# Patient Record
Sex: Female | Born: 1990 | Race: Black or African American | Hispanic: No | Marital: Married | Smoking: Never smoker
Health system: Southern US, Community
[De-identification: ages and names within clinical notes are randomized; demographics above are authoritative.]

## PROBLEM LIST (undated history)

## (undated) DIAGNOSIS — E119 Type 2 diabetes mellitus without complications: Secondary | ICD-10-CM

## (undated) DIAGNOSIS — I10 Essential (primary) hypertension: Secondary | ICD-10-CM

---
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
## 2017-07-02 ENCOUNTER — Emergency Department (HOSPITAL_BASED_OUTPATIENT_CLINIC_OR_DEPARTMENT_OTHER)
Admission: EM | Admit: 2017-07-02 | Discharge: 2017-07-03 | Disposition: A | Discharge status: Home / Self Care | Payer: Self-pay | Attending: Emergency Medicine | Admitting: Emergency Medicine

## 2017-07-02 ENCOUNTER — Encounter (HOSPITAL_BASED_OUTPATIENT_CLINIC_OR_DEPARTMENT_OTHER): Payer: Self-pay | Admitting: Emergency Medicine

## 2017-07-02 ENCOUNTER — Other Ambulatory Visit: Payer: Self-pay

## 2017-07-02 ENCOUNTER — Emergency Department (HOSPITAL_BASED_OUTPATIENT_CLINIC_OR_DEPARTMENT_OTHER): Payer: Self-pay

## 2017-07-02 DIAGNOSIS — I10 Essential (primary) hypertension: Secondary | ICD-10-CM | POA: Insufficient documentation

## 2017-07-02 DIAGNOSIS — R509 Fever, unspecified: Secondary | ICD-10-CM | POA: Insufficient documentation

## 2017-07-02 DIAGNOSIS — R1011 Right upper quadrant pain: Secondary | ICD-10-CM | POA: Insufficient documentation

## 2017-07-02 DIAGNOSIS — R638 Other symptoms and signs concerning food and fluid intake: Secondary | ICD-10-CM | POA: Insufficient documentation

## 2017-07-02 DIAGNOSIS — R110 Nausea: Secondary | ICD-10-CM | POA: Insufficient documentation

## 2017-07-02 DIAGNOSIS — Z79899 Other long term (current) drug therapy: Secondary | ICD-10-CM | POA: Insufficient documentation

## 2017-07-02 DIAGNOSIS — E119 Type 2 diabetes mellitus without complications: Secondary | ICD-10-CM | POA: Insufficient documentation

## 2017-07-02 HISTORY — DX: Type 2 diabetes mellitus without complications: E11.9

## 2017-07-02 HISTORY — DX: Essential (primary) hypertension: I10

## 2017-07-02 LAB — URINALYSIS, ROUTINE W REFLEX MICROSCOPIC
BILIRUBIN URINE: NEGATIVE
Glucose, UA: NEGATIVE mg/dL
KETONES UR: 15 mg/dL — AB
NITRITE: NEGATIVE
PH: 5.5 (ref 5.0–8.0)
PROTEIN: NEGATIVE mg/dL
Specific Gravity, Urine: 1.01 (ref 1.005–1.030)

## 2017-07-02 LAB — URINALYSIS, MICROSCOPIC (REFLEX)

## 2017-07-02 LAB — COMPREHENSIVE METABOLIC PANEL
ALBUMIN: 3.8 g/dL (ref 3.5–5.0)
ALK PHOS: 66 U/L (ref 38–126)
ALT: 28 U/L (ref 14–54)
ANION GAP: 11 (ref 5–15)
AST: 29 U/L (ref 15–41)
BUN: 9 mg/dL (ref 6–20)
CALCIUM: 9.2 mg/dL (ref 8.9–10.3)
CO2: 22 mmol/L (ref 22–32)
Chloride: 102 mmol/L (ref 101–111)
Creatinine, Ser: 1.08 mg/dL — ABNORMAL HIGH (ref 0.44–1.00)
GFR calc non Af Amer: >60 mL/min (ref >60)
GLUCOSE: 155 mg/dL — AB (ref 65–99)
POTASSIUM: 3.9 mmol/L (ref 3.5–5.1)
SODIUM: 135 mmol/L (ref 135–145)
Total Bilirubin: 0.8 mg/dL (ref 0.3–1.2)
Total Protein: 8.2 g/dL — ABNORMAL HIGH (ref 6.5–8.1)

## 2017-07-02 LAB — CBC
HEMATOCRIT: 35.1 % — AB (ref 36.0–46.0)
HEMOGLOBIN: 11.6 g/dL — AB (ref 12.0–15.0)
MCH: 28.5 pg (ref 26.0–34.0)
MCHC: 33 g/dL (ref 30.0–36.0)
MCV: 86.2 fL (ref 78.0–100.0)
Platelets: 319 10*3/uL (ref 150–400)
RBC: 4.07 MIL/uL (ref 3.87–5.11)
RDW: 12.3 % (ref 11.5–15.5)
WBC: 11.6 10*3/uL — ABNORMAL HIGH (ref 4.0–10.5)

## 2017-07-02 LAB — I-STAT CG4 LACTIC ACID, ED: LACTIC ACID, VENOUS: 1.17 mmol/L (ref 0.5–1.9)

## 2017-07-02 LAB — PROTIME-INR
INR: 1.16
PROTHROMBIN TIME: 14.7 s (ref 11.4–15.2)

## 2017-07-02 LAB — PREGNANCY, URINE: PREG TEST UR: NEGATIVE

## 2017-07-02 MED ORDER — FENTANYL CITRATE (PF) 100 MCG/2ML IJ SOLN
100.0000 ug | Freq: Once | INTRAMUSCULAR | Status: AC
  Administered 2017-07-02: 100 ug via INTRAVENOUS
  Filled 2017-07-02: qty 2

## 2017-07-02 MED ORDER — SODIUM CHLORIDE 0.9 % IV BOLUS (SEPSIS): 1000.0000 mL | Freq: Once | INTRAVENOUS | Status: DC

## 2017-07-02 MED ORDER — PIPERACILLIN-TAZOBACTAM 3.375 G IVPB: 3.3750 g | Freq: Three times a day (TID) | INTRAVENOUS | Status: DC

## 2017-07-02 MED ORDER — ACETAMINOPHEN 325 MG PO TABS
650.0000 mg | ORAL_TABLET | Freq: Once | ORAL | Status: AC
  Administered 2017-07-02: 650 mg via ORAL
  Filled 2017-07-02: qty 2

## 2017-07-02 MED ORDER — SODIUM CHLORIDE 0.9 % IV BOLUS (SEPSIS): 500.0000 mL | Freq: Once | INTRAVENOUS | Status: DC

## 2017-07-02 MED ORDER — PIPERACILLIN-TAZOBACTAM 3.375 G IVPB 30 MIN
3.3750 g | Freq: Once | INTRAVENOUS | Status: AC
  Administered 2017-07-02: 3.375 g via INTRAVENOUS
  Filled 2017-07-02 (×2): qty 50

## 2017-07-02 MED ORDER — SODIUM CHLORIDE 0.9 % IV BOLUS (SEPSIS)
2000.0000 mL | Freq: Once | INTRAVENOUS | Status: AC
  Administered 2017-07-02: 2000 mL via INTRAVENOUS

## 2017-07-02 MED ORDER — METOCLOPRAMIDE HCL 5 MG/ML IJ SOLN
10.0000 mg | Freq: Once | INTRAMUSCULAR | Status: AC
  Administered 2017-07-02: 10 mg via INTRAVENOUS
  Filled 2017-07-02: qty 2

## 2017-07-02 MED ORDER — IOPAMIDOL (ISOVUE-300) INJECTION 61%
100.0000 mL | Freq: Once | INTRAVENOUS | Status: AC | PRN
  Administered 2017-07-02: 100 mL via INTRAVENOUS

## 2017-07-02 NOTE — ED Triage Notes (Signed)
Sent from [facility] with fever and R flank pain x 2 days.

## 2017-07-02 NOTE — Progress Notes (Signed)
Pharmacy Antibiotic Note  Lauren Joyce is a 27 y.o. female admitted on 07/02/2017 with concern for intra-abdominal infection.  Pharmacy has been consulted for Zosyn dosing.  Plan: 1. Start Zosyn 3.375g IV every 8 hours (infused over 4 hours) 2. Will continue to follow renal function, culture results, LOT, and antibiotic de-escalation plans   Height: 5\' 7"  (170.2 cm) Weight: 232 lb (105.2 kg) IBW/kg (Calculated) : 61.6  Temp (24hrs), Avg:102.6 F (39.2 C), Min:101.9 F (38.8 C), Max:103.2 F (39.6 C)  Recent Labs  Lab 07/02/17 2032 07/02/17 2048  WBC 11.6*  --   CREATININE 1.08*  --   LATICACIDVEN  --  1.17    Estimated Creatinine Clearance: 96.7 mL/min (A) (by C-G formula based on SCr of 1.08 mg/dL (H)).    No Known Allergies  Antimicrobials this admission: Zosyn 1/26 >>  Dose adjustments this admission: n/a  Microbiology results: 1/26 BCx >> 1/26 UCx >>  Thank you for allowing pharmacy to be a part of this patient's care.  Lauren Joyce, Lauren Joyce 07/02/2017 9:41 PM

## 2017-07-02 NOTE — ED Provider Notes (Signed)
[facility] EMERGENCY DEPARTMENT Provider Note   CSN: [id number] Arrival date & time: 07/02/17  1844     History   Chief Complaint Chief Complaint  Patient presents with  . Flank Pain  . Fever    HPI Lauren Joyce is a 27 y.o. female.  Planes of right upper quadrant pain and right flank pain onset 3 days ago accompanied by diminished appetite and nausea.  No vomiting.  Symptoms worse with changing position and improved with remaining still.  Not affected by eating.  No other associated symptoms.  Menstrual period several years ago.  She denies cough.  [facility].  Sent here for further evaluation.  Treatment prior to coming here  HPI  Past Medical History:  Diagnosis Date  . Diabetes mellitus without complication (HCC)   . Hypertension    Past medical history prediabetes There are no active problems to display for this patient.   History reviewed. No pertinent surgical history.  OB History    No data available       Home Medications    Prior to Admission medications   Medication Sig Start Date End Date Taking? Authorizing Provider  labetalol (NORMODYNE) 200 MG tablet Take 200 mg by mouth 2 (two) times daily.   Yes [provider]    Family History No family history on file.  Social History Social History   Tobacco Use  . Smoking status: Never Smoker  . Smokeless tobacco: Never Used  Substance Use Topics  . Alcohol use: Not on file  . Drug use: Not on file     Allergies   Patient has no known allergies.   Review of Systems Review of Systems  Genitourinary: Positive for flank pain.  Neurological: Positive for weakness.       Generalized weakness  All other systems reviewed and are negative.    Physical Exam Updated Vital Signs BP 125/81 (BP Location: Left Arm)   Pulse 98   Temp (!) 101.9 F (38.8 C) (Oral)   Resp 20   Ht 5\' 7"  (1.702 m)   Wt 105.2 kg (232 lb)   SpO2 97%   BMI  36.34 kg/m   Physical Exam  Constitutional: She appears well-developed and well-nourished. No distress.  HENT:  Head: Normocephalic and atraumatic.  Eyes: Conjunctivae are normal. Pupils are equal, round, and reactive to light.  Neck: Neck supple. No tracheal deviation present. No thyromegaly present.  Cardiovascular: Regular rhythm.  No murmur heard. Mildly tachycardic  Pulmonary/Chest: Effort normal and breath sounds normal.  Abdominal: Soft. Bowel sounds are normal. She exhibits no distension. There is tenderness.  Right upper quadrant tenderness.  Negative Murphy sign  Genitourinary:  Genitourinary Comments: Right flank tenderness  Musculoskeletal: Normal range of motion. She exhibits no edema or tenderness.  Neurological: She is alert. Coordination normal.  Skin: Skin is warm and dry. No rash noted.  Psychiatric: She has a normal mood and affect.  Nursing note and vitals reviewed.    ED Treatments / Results  Labs (all labs ordered are listed, but only abnormal results are displayed) Labs Reviewed  COMPREHENSIVE METABOLIC PANEL - Abnormal; Notable for the following components:      Result Value   Glucose, Bld 155 (*)    Creatinine, Ser 1.08 (*)    Total Protein 8.2 (*)    All other components within normal limits  CBC - Abnormal; Notable for the following components:   WBC 11.6 (*)  Hemoglobin 11.6 (*)    HCT 35.1 (*)    All other components within normal limits  CULTURE, BLOOD (ROUTINE X 2)  CULTURE, BLOOD (ROUTINE X 2)  URINE CULTURE  PROTIME-INR  URINALYSIS, ROUTINE W REFLEX MICROSCOPIC  PREGNANCY, URINE  COMPREHENSIVE METABOLIC PANEL  I-STAT CG4 LACTIC ACID, ED  I-STAT CG4 LACTIC ACID, ED    EKG  EKG Interpretation None      Results for orders placed or performed during the hospital encounter of 07/02/17  Urinalysis, Routine w reflex microscopic- may I&O cath if menses  Result Value Ref Range   Color, Urine YELLOW YELLOW   APPearance CLEAR CLEAR     Specific Gravity, Urine 1.010 1.005 - 1.030   pH 5.5 5.0 - 8.0   Glucose, UA NEGATIVE NEGATIVE mg/dL   Hgb urine dipstick SMALL (A) NEGATIVE   Bilirubin Urine NEGATIVE NEGATIVE   Ketones, ur 15 (A) NEGATIVE mg/dL   Protein, ur NEGATIVE NEGATIVE mg/dL   Nitrite NEGATIVE NEGATIVE   Leukocytes, UA TRACE (A) NEGATIVE  Pregnancy, urine  Result Value Ref Range   Preg Test, Ur NEGATIVE NEGATIVE  Comprehensive metabolic panel  Result Value Ref Range   Sodium 135 135 - 145 mmol/L   Potassium 3.9 3.5 - 5.1 mmol/L   Chloride 102 101 - 111 mmol/L   CO2 22 22 - 32 mmol/L   Glucose, Bld 155 (H) 65 - 99 mg/dL   BUN 9 6 - 20 mg/dL   Creatinine, Ser 1.61 (H) 0.44 - 1.00 mg/dL   Calcium 9.2 8.9 - 09.6 mg/dL   Total Protein 8.2 (H) 6.5 - 8.1 g/dL   Albumin 3.8 3.5 - 5.0 g/dL   AST 29 15 - 41 U/L   ALT 28 14 - 54 U/L   Alkaline Phosphatase 66 38 - 126 U/L   Total Bilirubin 0.8 0.3 - 1.2 mg/dL   GFR calc non Af Amer >60 >60 mL/min   GFR calc Af Amer >60 >60 mL/min   Anion gap 11 5 - 15  CBC  Result Value Ref Range   WBC 11.6 (H) 4.0 - 10.5 K/uL   RBC 4.07 3.87 - 5.11 MIL/uL   Hemoglobin 11.6 (L) 12.0 - 15.0 g/dL   HCT 04.5 (L) 40.9 - 81.1 %   MCV 86.2 78.0 - 100.0 fL   MCH 28.5 26.0 - 34.0 pg   MCHC 33.0 30.0 - 36.0 g/dL   RDW 91.4 78.2 - 95.6 %   Platelets 319 150 - 400 K/uL  Protime-INR  Result Value Ref Range   Prothrombin Time 14.7 11.4 - 15.2 seconds   INR 1.16   Urinalysis, Microscopic (reflex)  Result Value Ref Range   RBC / HPF 0-5 0 - 5 RBC/hpf   WBC, UA 0-5 0 - 5 WBC/hpf   Bacteria, UA RARE (A) NONE SEEN   Squamous Epithelial / LPF 0-5 (A) NONE SEEN  I-Stat CG4 Lactic Acid, ED  Result Value Ref Range   Lactic Acid, Venous 1.17 0.5 - 1.9 mmol/L   Dg Chest 2 View  Result Date: 07/03/2017 CLINICAL DATA:  Right-sided chest pain, fever, and abdominal pain. Constipation for several days. EXAM: CHEST  2 VIEW COMPARISON:  None. FINDINGS: The heart size and mediastinal  contours are within normal limits. Both lungs are clear. The visualized skeletal structures are unremarkable. IMPRESSION: No active cardiopulmonary disease. Electronically Signed   By: Burman Nieves M.D.   On: 07/03/2017 01:06   Ct Abdomen Pelvis W Contrast  Result Date: 07/02/2017 CLINICAL DATA:  Abdominal and flank pain on the right for 2 days. Fever. EXAM: CT ABDOMEN AND PELVIS WITH CONTRAST TECHNIQUE: Multidetector CT imaging of the abdomen and pelvis was performed using the standard protocol following bolus administration of intravenous contrast. CONTRAST:  ISOVUE-300 IOPAMIDOL (ISOVUE-300) INJECTION 61% COMPARISON:  None. FINDINGS: Lower chest: No basilar pulmonary nodules or pleural effusion. No apical pericardial effusion. Hepatobiliary: Diffuse hepatic steatosis. No intrahepatic biliary dilatation. No focal hepatic lesion. There is focal fatty sparing of the gallbladder fossa. Normal gallbladder. Pancreas: Normal parenchymal contours without ductal dilatation. No peripancreatic fluid collection. Spleen: Normal. Adrenals/Urinary Tract: --Adrenal glands: Normal. --Right kidney/ureter: No hydronephrosis, perinephric stranding or nephrolithiasis. No obstructing ureteral stones. --Left kidney/ureter: No hydronephrosis, perinephric stranding or nephrolithiasis. No obstructing ureteral stones. --Urinary bladder: Normal appearance for the degree of distention. Stomach/Bowel: --Stomach/Duodenum: No hiatal hernia or other gastric abnormality. Normal duodenal course. --Small bowel: No dilatation or inflammation. --Colon: No focal abnormality. --Appendix: Normal caliber.  No hyperenhancement. Vascular/Lymphatic: Normal course and caliber of the major abdominal arteries. The portal vein, splenic vein, superior mesenteric vein and IVC are patent. There are a few borderline enlarged lymph nodes in the right lower quadrant measuring up to 8 millimeters, which are nonspecific. Reproductive: No free fluid in the  pelvis. Musculoskeletal. No bony spinal canal stenosis or focal osseous abnormality. Other: None. IMPRESSION: 1. No obstructive uropathy or nephrolithiasis. 2. Normal caliber appendix. 3. Diffuse hepatic steatosis. Electronically Signed   By: Deatra Robinson M.D.   On: 07/02/2017 23:05    Radiology No results found.  Procedures Procedures (including critical care time)  Medications Ordered in ED Medications  sodium chloride 0.9 % bolus 1,000 mL (not administered)    And  sodium chloride 0.9 % bolus 1,000 mL (not administered)    And  sodium chloride 0.9 % bolus 1,000 mL (not administered)    And  sodium chloride 0.9 % bolus 500 mL (not administered)  piperacillin-tazobactam (ZOSYN) IVPB 3.375 g (not administered)  metoCLOPramide (REGLAN) injection 10 mg (not administered)  fentaNYL (SUBLIMAZE) injection 100 mcg (not administered)  acetaminophen (TYLENOL) tablet 650 mg (650 mg Oral Given 07/02/17 1904)     Initial Impression / Assessment and Plan / ED Course  I have reviewed the triage vital signs and the nursing notes.  Pertinent labs & imaging results that were available during my care of the patient were reviewed by me and considered in my medical decision making (see chart for details).     Code sepsis called based on sirs criteria fever and tachycardia.  Source of infection felt to be intra-abdominal 1040 feels improved resting comfortably since treatment with intravenous antibiotics, Tylenol, intravenous normal saline bolus, IV antibiotic, IV opioids and IV antiemetics patient alert and appears in no distress 1:30 AM patient felt not to be septic she is resting comfortably.  She had stable vital signs throughout ED course and is no longer distress.  There is no evidence of intra-abdominal process urinary tract infection or pneumonia.  Blood cultures pending.  Plan Tylenol encourage oral hydration, follow-up with PMD if continues to have fever or not improving in 2 days or return  if condition worsens. Final Clinical Impressions(s) / ED Diagnoses  Diagnosis febrile illness Final diagnoses:  None    ED Discharge Orders    None       Doug Sou, MD 07/03/17 [phone]

## 2017-07-03 ENCOUNTER — Emergency Department (HOSPITAL_BASED_OUTPATIENT_CLINIC_OR_DEPARTMENT_OTHER): Payer: Self-pay

## 2017-07-03 NOTE — ED Notes (Signed)
Pt at xray

## 2017-07-03 NOTE — ED Notes (Signed)
Spoke with EDP. Pt does not need an additional lactic acid drawn.

## 2017-07-03 NOTE — Discharge Instructions (Signed)
Take Tylenol as directed every 4 hours as needed for aches or for temperature higher than 100.4 while awake.Make sure that you drink at least six 8 ounce glasses of water each day in order to stay well-hydrated.  Return if you feel worse for any reason or see your primary care physician if you continue to have fever in 2 days, or you can return here for reexamination.

## 2017-07-04 LAB — URINE CULTURE: Culture: NO GROWTH

## 2017-07-08 LAB — CULTURE, BLOOD (ROUTINE X 2)
CULTURE: NO GROWTH
Culture: NO GROWTH
SPECIAL REQUESTS: ADEQUATE
SPECIAL REQUESTS: ADEQUATE

## 2019-08-04 IMAGING — CT CT ABD-PELV W/ CM
2 of 4 series · 16 of 46 positions shown, 18 images · IV contrast (APPLIED)
Comparison: None.

CLINICAL DATA: Abdominal and flank pain on the right for 2 days.
Fever.

EXAM:
CT ABDOMEN AND PELVIS WITH CONTRAST
TECHNIQUE: Multidetector CT imaging of the abdomen and pelvis was performed
using the standard protocol following bolus administration of
intravenous contrast.
CONTRAST:  100mL XAGFSB-H99 IOPAMIDOL (XAGFSB-H99) INJECTION 61%

[Series 2: axial st · axial · 0.75mm/px · z∈[-670,-200]mm · 13 of 104 slices shown, 15 images]
[im 5/104  soft-tissue]
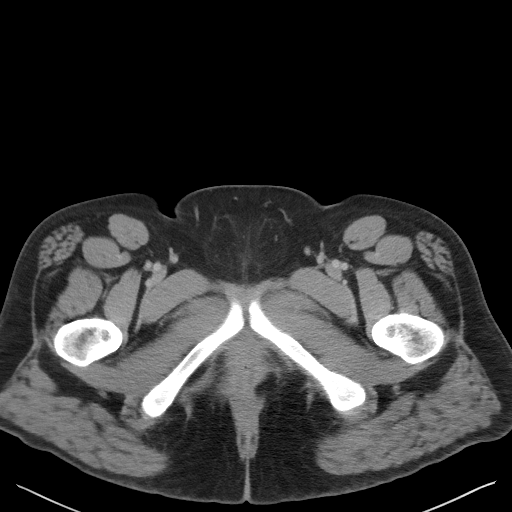
[im 5/104  bone]
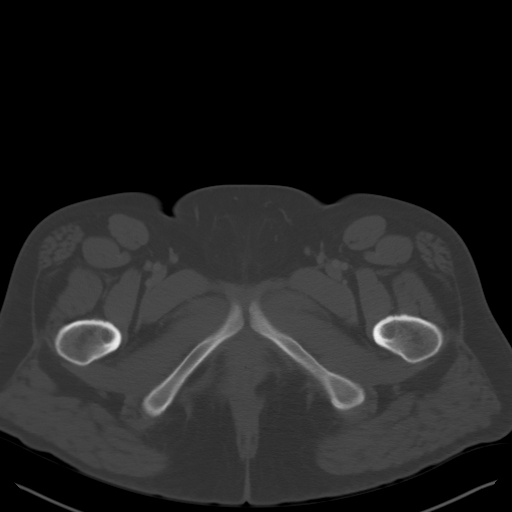
[im 13/104  soft-tissue]
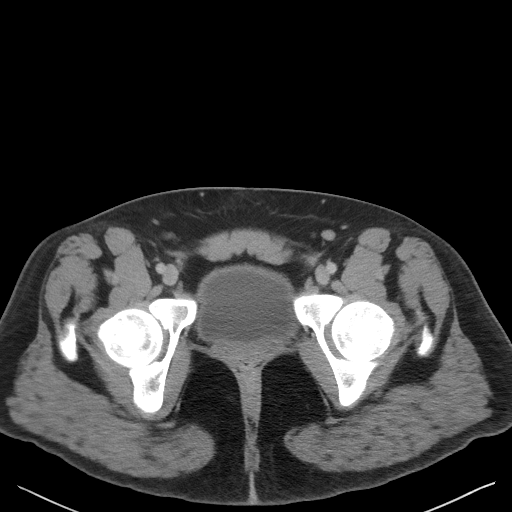
[im 22/104  soft-tissue]
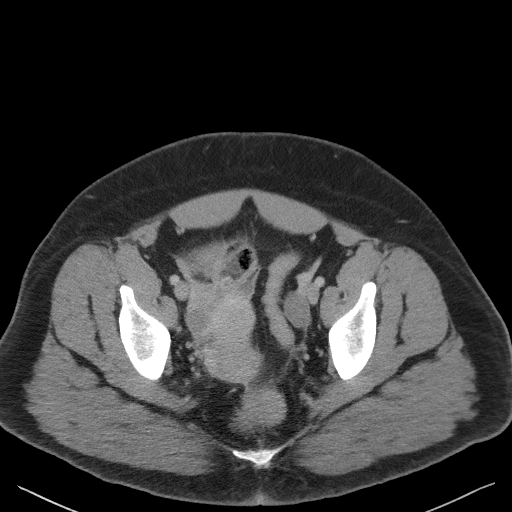
[im 31/104  soft-tissue]
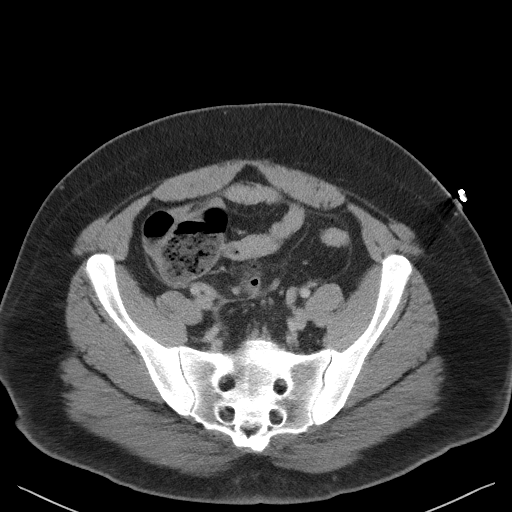
[im 35/104  soft-tissue]
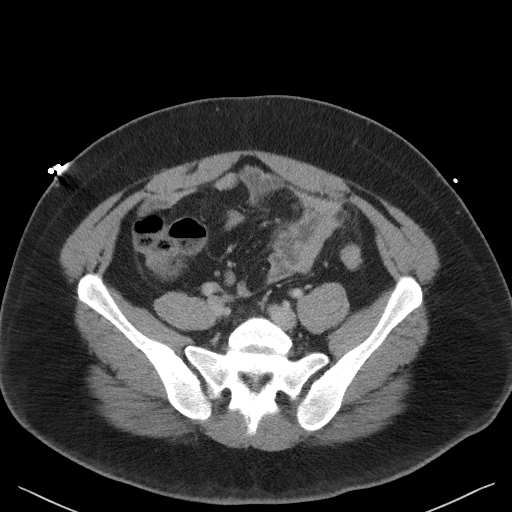
[im 43/104  soft-tissue]
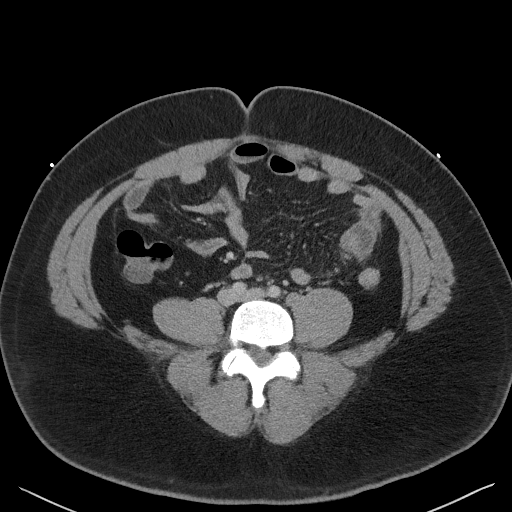
[im 52/104  soft-tissue]
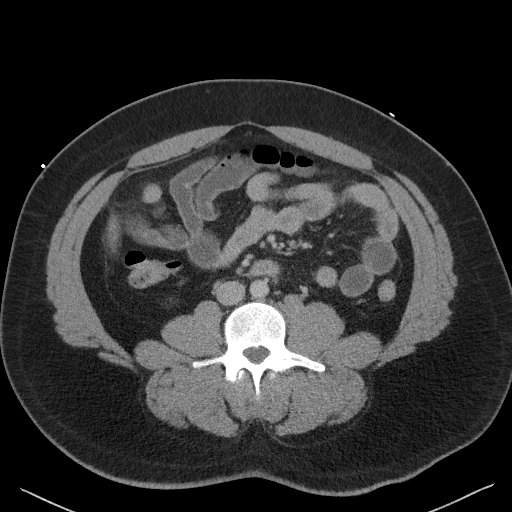
[im 61/104  soft-tissue]
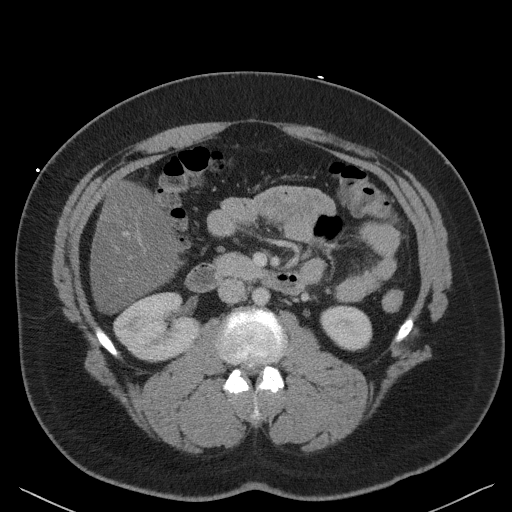
[im 69/104  soft-tissue]
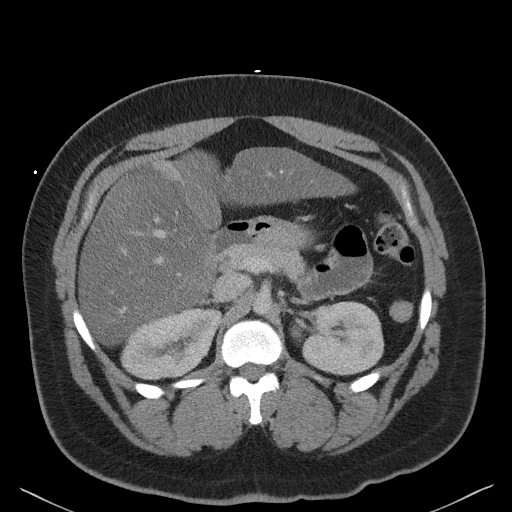
[im 69/104  bone]
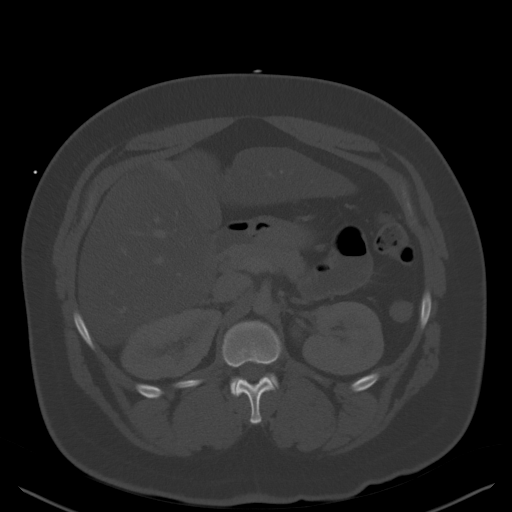
[im 73/104  soft-tissue]
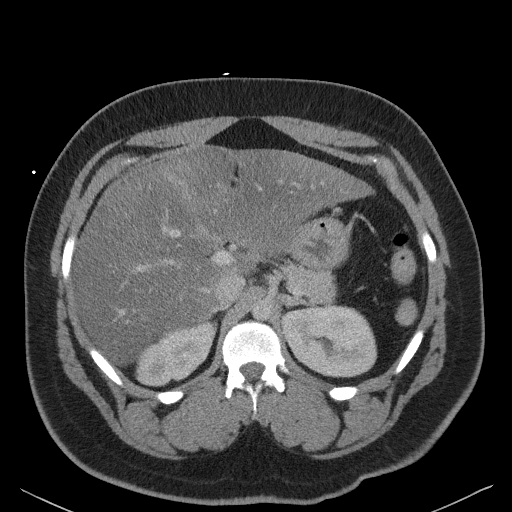
[im 82/104  soft-tissue]
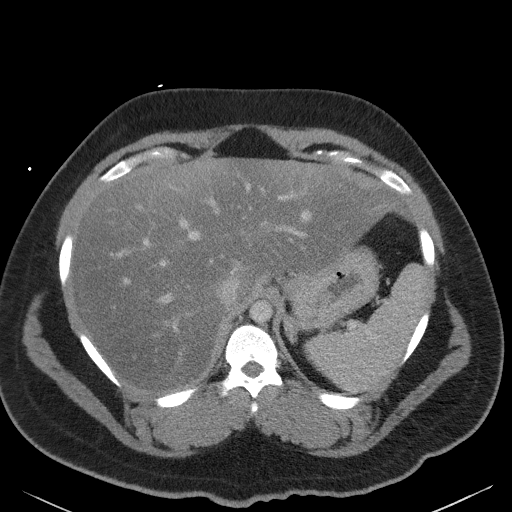
[im 91/104  soft-tissue]
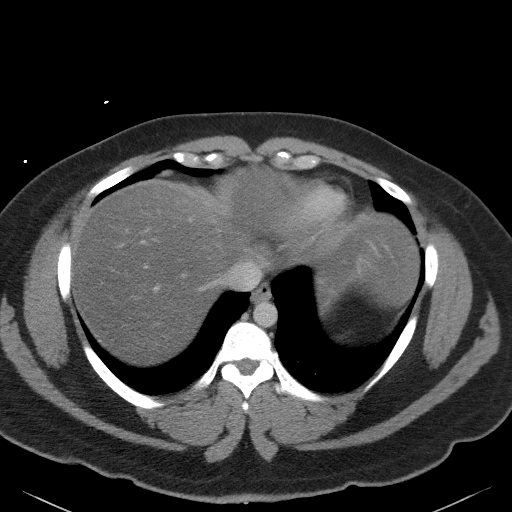
[im 99/104  soft-tissue]
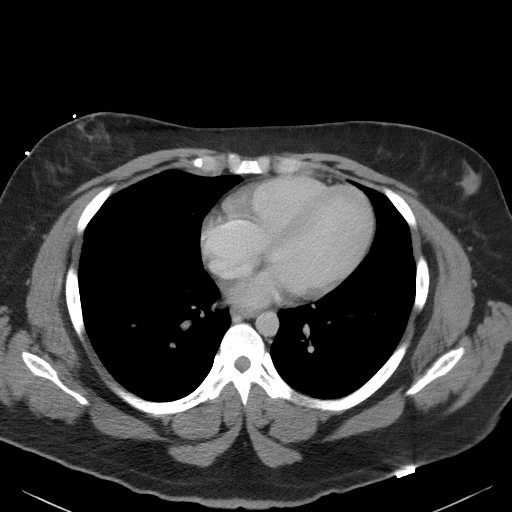

[Series 5: coronal st · coronal · 0.77mm/px · 3 of 106 slices shown]
[im 36/106  soft-tissue]
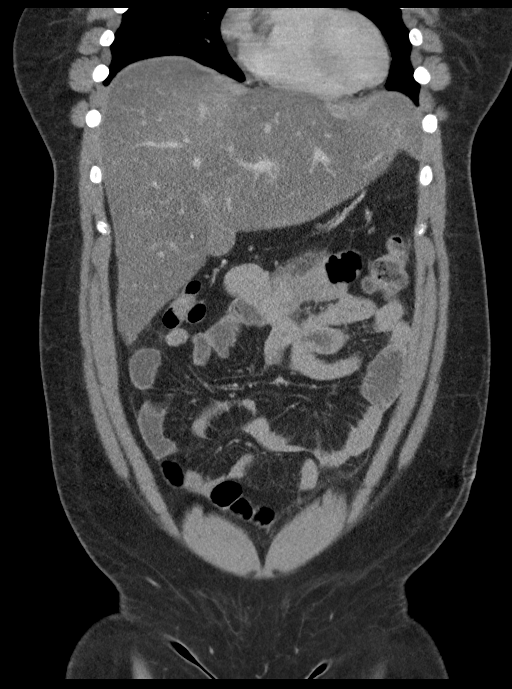
[im 47/106  soft-tissue]
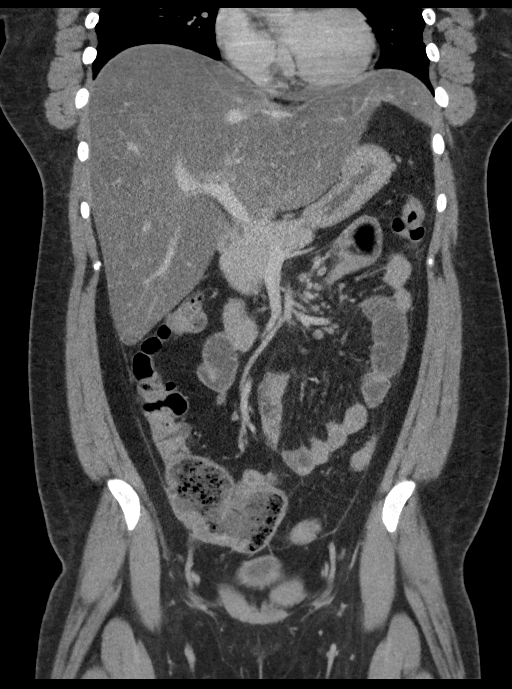
[im 59/106  soft-tissue]
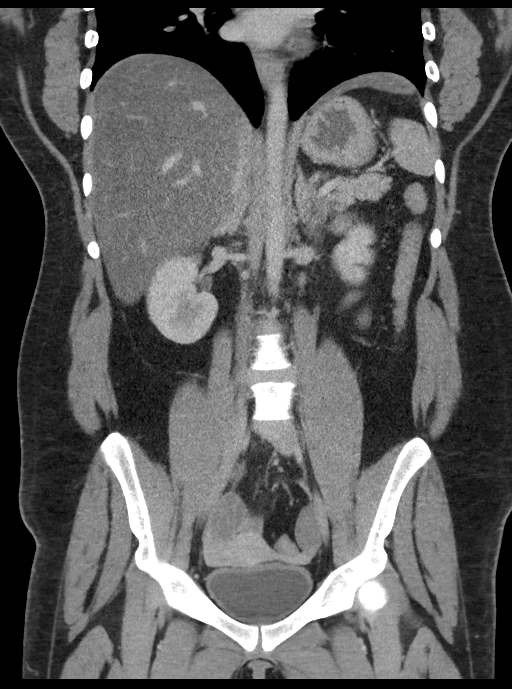

[16 of 46 positions shown; findings below may reference images not displayed]

FINDINGS: Lower chest: No basilar pulmonary nodules or pleural effusion. No
apical pericardial effusion.

Hepatobiliary: Diffuse hepatic steatosis. No intrahepatic biliary
dilatation. No focal hepatic lesion. There is focal fatty sparing of
the gallbladder fossa. Normal gallbladder.

Pancreas: Normal parenchymal contours without ductal dilatation. No
peripancreatic fluid collection.

Spleen: Normal.

Adrenals/Urinary Tract:

--Adrenal glands: Normal.

--Right kidney/ureter: No hydronephrosis, perinephric stranding or
nephrolithiasis. No obstructing ureteral stones.

--Left kidney/ureter: No hydronephrosis, perinephric stranding or
nephrolithiasis. No obstructing ureteral stones.

--Urinary bladder: Normal appearance for the degree of distention.

Stomach/Bowel:

--Stomach/Duodenum: No hiatal hernia or other gastric abnormality.
Normal duodenal course.

--Small bowel: No dilatation or inflammation.

--Colon: No focal abnormality.

--Appendix: Normal caliber.  No hyperenhancement.

Vascular/Lymphatic: Normal course and caliber of the major abdominal
arteries. The portal vein, splenic vein, superior mesenteric vein
and IVC are patent. There are a few borderline enlarged lymph nodes
in the right lower quadrant measuring up to 8 millimeters, which are
nonspecific.

Reproductive: No free fluid in the pelvis.

Musculoskeletal. No bony spinal canal stenosis or focal osseous
abnormality.

Other: None.
IMPRESSION: 1. No obstructive uropathy or nephrolithiasis.
2. Normal caliber appendix.
3. Diffuse hepatic steatosis.
